# Patient Record
Sex: Female | Born: 1960 | Race: Black or African American | Hispanic: No | Marital: Married | Smoking: Never smoker
Health system: Southern US, Community
[De-identification: ages and names within clinical notes are randomized; demographics above are authoritative.]

## PROBLEM LIST (undated history)

## (undated) DIAGNOSIS — I10 Essential (primary) hypertension: Secondary | ICD-10-CM

## (undated) DIAGNOSIS — E119 Type 2 diabetes mellitus without complications: Secondary | ICD-10-CM

## (undated) HISTORY — DX: Type 2 diabetes mellitus without complications: E11.9

## (undated) HISTORY — DX: Essential (primary) hypertension: I10

## (undated) HISTORY — PX: ECTOPIC PREGNANCY SURGERY: SHX613

---

## 2006-06-23 ENCOUNTER — Encounter: Admission: RE | Admit: 2006-06-23 | Discharge: 2006-06-23 | Payer: Self-pay | Admitting: Internal Medicine

## 2006-06-29 ENCOUNTER — Other Ambulatory Visit: Admission: RE | Admit: 2006-06-29 | Discharge: 2006-06-29 | Payer: Self-pay | Admitting: Obstetrics and Gynecology

## 2007-10-21 ENCOUNTER — Encounter: Admission: RE | Admit: 2007-10-21 | Discharge: 2007-10-21 | Payer: Self-pay | Admitting: Internal Medicine

## 2007-10-28 ENCOUNTER — Encounter: Admission: RE | Admit: 2007-10-28 | Discharge: 2007-10-28 | Payer: Self-pay | Admitting: Internal Medicine

## 2008-06-12 ENCOUNTER — Emergency Department: Payer: Self-pay | Admitting: Emergency Medicine

## 2009-07-03 ENCOUNTER — Encounter: Admission: RE | Admit: 2009-07-03 | Discharge: 2009-07-03 | Payer: Self-pay | Admitting: Internal Medicine

## 2010-07-04 ENCOUNTER — Encounter: Admission: RE | Admit: 2010-07-04 | Discharge: 2010-07-04 | Payer: Self-pay | Admitting: Internal Medicine

## 2011-01-19 ENCOUNTER — Encounter: Payer: Self-pay | Admitting: Internal Medicine

## 2011-06-10 ENCOUNTER — Other Ambulatory Visit: Payer: Self-pay | Admitting: Internal Medicine

## 2011-06-10 DIAGNOSIS — Z1231 Encounter for screening mammogram for malignant neoplasm of breast: Secondary | ICD-10-CM

## 2011-07-30 ENCOUNTER — Ambulatory Visit
Admission: RE | Admit: 2011-07-30 | Discharge: 2011-07-30 | Disposition: A | Discharge status: Home / Self Care | Payer: BC Managed Care – PPO | Source: Ambulatory Visit | Attending: Internal Medicine | Admitting: Internal Medicine

## 2011-07-30 DIAGNOSIS — Z1231 Encounter for screening mammogram for malignant neoplasm of breast: Secondary | ICD-10-CM

## 2013-06-02 ENCOUNTER — Other Ambulatory Visit: Payer: Self-pay

## 2013-06-02 DIAGNOSIS — Z1231 Encounter for screening mammogram for malignant neoplasm of breast: Secondary | ICD-10-CM

## 2013-07-15 ENCOUNTER — Ambulatory Visit
Admission: RE | Admit: 2013-07-15 | Discharge: 2013-07-15 | Disposition: A | Discharge status: Home / Self Care | Payer: BC Managed Care – PPO | Source: Ambulatory Visit

## 2013-07-15 DIAGNOSIS — Z1231 Encounter for screening mammogram for malignant neoplasm of breast: Secondary | ICD-10-CM

## 2014-08-22 ENCOUNTER — Other Ambulatory Visit: Payer: Self-pay

## 2014-08-22 DIAGNOSIS — Z1231 Encounter for screening mammogram for malignant neoplasm of breast: Secondary | ICD-10-CM

## 2014-09-21 ENCOUNTER — Ambulatory Visit: Payer: BC Managed Care – PPO

## 2014-10-19 ENCOUNTER — Ambulatory Visit: Payer: BC Managed Care – PPO

## 2014-11-16 ENCOUNTER — Ambulatory Visit: Payer: BC Managed Care – PPO

## 2014-12-05 ENCOUNTER — Ambulatory Visit: Payer: BC Managed Care – PPO

## 2014-12-27 ENCOUNTER — Ambulatory Visit: Payer: BC Managed Care – PPO

## 2015-02-21 DIAGNOSIS — D219 Benign neoplasm of connective and other soft tissue, unspecified: Secondary | ICD-10-CM | POA: Insufficient documentation

## 2017-06-11 ENCOUNTER — Other Ambulatory Visit: Payer: Self-pay | Admitting: Internal Medicine

## 2017-06-11 DIAGNOSIS — Z1231 Encounter for screening mammogram for malignant neoplasm of breast: Secondary | ICD-10-CM

## 2017-07-06 ENCOUNTER — Ambulatory Visit: Payer: BC Managed Care – PPO

## 2017-07-07 ENCOUNTER — Ambulatory Visit
Admission: RE | Admit: 2017-07-07 | Discharge: 2017-07-07 | Disposition: A | Discharge status: Home / Self Care | Payer: BC Managed Care – PPO | Source: Ambulatory Visit | Attending: Internal Medicine | Admitting: Internal Medicine

## 2017-07-07 DIAGNOSIS — Z1231 Encounter for screening mammogram for malignant neoplasm of breast: Secondary | ICD-10-CM

## 2018-06-15 ENCOUNTER — Ambulatory Visit (INDEPENDENT_AMBULATORY_CARE_PROVIDER_SITE_OTHER): Payer: BC Managed Care – PPO | Admitting: Family Medicine

## 2018-06-15 ENCOUNTER — Encounter: Payer: Self-pay | Admitting: Family Medicine

## 2018-06-15 VITALS — BP 146/89 | HR 81 | Ht 62.0 in | Wt 159.0 lb

## 2018-06-15 DIAGNOSIS — Z1239 Encounter for other screening for malignant neoplasm of breast: Secondary | ICD-10-CM

## 2018-06-15 DIAGNOSIS — Z01419 Encounter for gynecological examination (general) (routine) without abnormal findings: Secondary | ICD-10-CM | POA: Diagnosis not present

## 2018-06-15 DIAGNOSIS — Z1231 Encounter for screening mammogram for malignant neoplasm of breast: Secondary | ICD-10-CM

## 2018-06-15 DIAGNOSIS — Z1151 Encounter for screening for human papillomavirus (HPV): Secondary | ICD-10-CM | POA: Diagnosis not present

## 2018-06-15 DIAGNOSIS — Z124 Encounter for screening for malignant neoplasm of cervix: Secondary | ICD-10-CM

## 2018-06-15 DIAGNOSIS — N841 Polyp of cervix uteri: Secondary | ICD-10-CM

## 2018-06-15 NOTE — Patient Instructions (Signed)
Preventive Care 40-64 Years, Female Preventive care refers to lifestyle choices and visits with your health care provider that can promote health and wellness. What does preventive care include?  A yearly physical exam. This is also called an annual well check.  Dental exams once or twice a year.  Routine eye exams. Ask your health care provider how often you should have your eyes checked.  Personal lifestyle choices, including: ? Daily care of your teeth and gums. ? Regular physical activity. ? Eating a healthy diet. ? Avoiding tobacco and drug use. ? Limiting alcohol use. ? Practicing safe sex. ? Taking low-dose aspirin daily starting at age 58. ? Taking vitamin and mineral supplements as recommended by your health care provider. What happens during an annual well check? The services and screenings done by your health care provider during your annual well check will depend on your age, overall health, lifestyle risk factors, and family history of disease. Counseling Your health care provider may ask you questions about your:  Alcohol use.  Tobacco use.  Drug use.  Emotional well-being.  Home and relationship well-being.  Sexual activity.  Eating habits.  Work and work Statistician.  Method of birth control.  Menstrual cycle.  Pregnancy history.  Screening You may have the following tests or measurements:  Height, weight, and BMI.  Blood pressure.  Lipid and cholesterol levels. These may be checked every 5 years, or more frequently if you are over 81 years old.  Skin check.  Lung cancer screening. You may have this screening every year starting at age 78 if you have a 30-pack-year history of smoking and currently smoke or have quit within the past 15 years.  Fecal occult blood test (FOBT) of the stool. You may have this test every year starting at age 65.  Flexible sigmoidoscopy or colonoscopy. You may have a sigmoidoscopy every 5 years or a colonoscopy  every 10 years starting at age 30.  Hepatitis C blood test.  Hepatitis B blood test.  Sexually transmitted disease (STD) testing.  Diabetes screening. This is done by checking your blood sugar (glucose) after you have not eaten for a while (fasting). You may have this done every 1-3 years.  Mammogram. This may be done every 1-2 years. Talk to your health care provider about when you should start having regular mammograms. This may depend on whether you have a family history of breast cancer.  BRCA-related cancer screening. This may be done if you have a family history of breast, ovarian, tubal, or peritoneal cancers.  Pelvic exam and Pap test. This may be done every 3 years starting at age 80. Starting at age 36, this may be done every 5 years if you have a Pap test in combination with an HPV test.  Bone density scan. This is done to screen for osteoporosis. You may have this scan if you are at high risk for osteoporosis.  Discuss your test results, treatment options, and if necessary, the need for more tests with your health care provider. Vaccines Your health care provider may recommend certain vaccines, such as:  Influenza vaccine. This is recommended every year.  Tetanus, diphtheria, and acellular pertussis (Tdap, Td) vaccine. You may need a Td booster every 10 years.  Varicella vaccine. You may need this if you have not been vaccinated.  Zoster vaccine. You may need this after age 5.  Measles, mumps, and rubella (MMR) vaccine. You may need at least one dose of MMR if you were born in  1959 or later. You may also need a second dose.  Pneumococcal 13-valent conjugate (PCV13) vaccine. You may need this if you have certain conditions and were not previously vaccinated.  Pneumococcal polysaccharide (PPSV23) vaccine. You may need one or two doses if you smoke cigarettes or if you have certain conditions.  Meningococcal vaccine. You may need this if you have certain  conditions.  Hepatitis A vaccine. You may need this if you have certain conditions or if you travel or work in places where you may be exposed to hepatitis A.  Hepatitis B vaccine. You may need this if you have certain conditions or if you travel or work in places where you may be exposed to hepatitis B.  Haemophilus influenzae type b (Hib) vaccine. You may need this if you have certain conditions.  Talk to your health care provider about which screenings and vaccines you need and how often you need them. This information is not intended to replace advice given to you by your health care provider. Make sure you discuss any questions you have with your health care provider. Document Released: 01/11/2016 Document Revised: 09/03/2016 Document Reviewed: 10/16/2015 Elsevier Interactive Patient Education  2018 Elsevier Inc[employer]  

## 2018-06-15 NOTE — Progress Notes (Signed)
Subjective:     Alisha Young is a 57 y.o. female and is here for a comprehensive physical exam. The patient reports no problems.  Social History   Socioeconomic History  . Marital status: Married    Spouse name: Not on file  . Number of children: Not on file  . Years of education: Not on file  . Highest education level: Not on file  Occupational History  . Not on file  Social Needs  . Financial resource strain: Not on file  . Food insecurity:    Worry: Not on file    Inability: Not on file  . Transportation needs:    Medical: Not on file    Non-medical: Not on file  Tobacco Use  . Smoking status: Never Smoker  . Smokeless tobacco: Never Used  Substance and Sexual Activity  . Alcohol use: Yes    Comment: once monthly  . Drug use: Never  . Sexual activity: Not Currently  Lifestyle  . Physical activity:    Days per week: Not on file    Minutes per session: Not on file  . Stress: Not on file  Relationships  . Social connections:    Talks on phone: Not on file    Gets together: Not on file    Attends religious service: Not on file    Active member of club or organization: Not on file    Attends meetings of clubs or organizations: Not on file    Relationship status: Not on file  . Intimate partner violence:    Fear of current or ex partner: Not on file    Emotionally abused: Not on file    Physically abused: Not on file    Forced sexual activity: Not on file  Other Topics Concern  . Not on file  Social History Narrative  . Not on file   Health Maintenance  Topic Date Due  . Hepatitis C Screening  08/29/1959  . HIV Screening  01/23/1974  . TETANUS/TDAP  01/23/1978  . PAP SMEAR  01/24/1980  . COLONOSCOPY  01/23/2009  . MAMMOGRAM  07/16/2015  . INFLUENZA VACCINE  07/29/2018    The following portions of the patient's history were reviewed and updated as appropriate: allergies, current medications, past family history, past medical history, past social  history, past surgical history and problem list.  Review of Systems Pertinent items noted in HPI and remainder of comprehensive ROS otherwise negative.   Objective:    BP (!) 146/89   Pulse 81   Ht 5\' 2"  (1.575 m)   Wt 159 lb (72.1 kg)   LMP 05/16/2013   BMI 29.08 kg/m  General appearance: alert, cooperative and appears stated age Head: Normocephalic, without obvious abnormality, atraumatic Neck: no adenopathy, supple, symmetrical, trachea midline and thyroid not enlarged, symmetric, no tenderness/mass/nodules Lungs: clear to auscultation bilaterally Breasts: normal appearance, no masses or tenderness Heart: regular rate and rhythm, S1, S2 normal, no murmur, click, rub or gallop Abdomen: soft, non-tender; bowel sounds normal; no masses,  no organomegaly Pelvic: external genitalia normal, no adnexal masses or tenderness, no cervical motion tenderness, uterus normal size, shape, and consistency, vagina normal without discharge and cervical polyp noted Extremities: extremities normal, atraumatic, no cyanosis or edema Pulses: 2+ and symmetric Skin: Skin color, texture, turgor normal. No rashes or lesions Lymph nodes: Cervical, supraclavicular, and axillary nodes normal. Neurologic: Grossly normal  Procedure: Cervix visualized and polyp noted.  After pap smear obtained.  Ring forcep applied to cervix  and twisting motion removed polyp intact.  Hemostasis obtained with pressure.  Assessment:    Healthy female exam.      Plan:     Screening for malignant neoplasm of cervix - Plan: Cytology - PAP  Encounter for gynecological examination without abnormal finding  Screening for breast cancer - Plan: MM DIGITAL SCREENING BILATERAL  Cervical polyp - send to pathology - Plan: Surgical pathology  Return in 1 year (on 06/16/2019).  See After Visit Summary for Counseling Recommendations

## 2018-06-17 LAB — CYTOLOGY - PAP
Diagnosis: NEGATIVE
HPV: NOT DETECTED

## 2018-07-29 ENCOUNTER — Ambulatory Visit
Admission: RE | Admit: 2018-07-29 | Discharge: 2018-07-29 | Disposition: A | Discharge status: Home / Self Care | Payer: BC Managed Care – PPO | Source: Ambulatory Visit | Attending: Family Medicine | Admitting: Family Medicine

## 2018-07-29 DIAGNOSIS — Z1239 Encounter for other screening for malignant neoplasm of breast: Secondary | ICD-10-CM

## 2018-11-23 ENCOUNTER — Emergency Department
Admission: EM | Admit: 2018-11-23 | Discharge: 2018-11-23 | Disposition: A | Discharge status: Home / Self Care | Payer: BC Managed Care – PPO | Attending: Emergency Medicine | Admitting: Emergency Medicine

## 2018-11-23 ENCOUNTER — Encounter: Payer: Self-pay | Admitting: Emergency Medicine

## 2018-11-23 ENCOUNTER — Other Ambulatory Visit: Payer: Self-pay

## 2018-11-23 DIAGNOSIS — I10 Essential (primary) hypertension: Secondary | ICD-10-CM | POA: Diagnosis not present

## 2018-11-23 DIAGNOSIS — R002 Palpitations: Secondary | ICD-10-CM | POA: Insufficient documentation

## 2018-11-23 DIAGNOSIS — Z79899 Other long term (current) drug therapy: Secondary | ICD-10-CM | POA: Diagnosis not present

## 2018-11-23 DIAGNOSIS — E119 Type 2 diabetes mellitus without complications: Secondary | ICD-10-CM | POA: Diagnosis not present

## 2018-11-23 DIAGNOSIS — Z7984 Long term (current) use of oral hypoglycemic drugs: Secondary | ICD-10-CM | POA: Insufficient documentation

## 2018-11-23 LAB — BASIC METABOLIC PANEL
Anion gap: 8 (ref 5–15)
BUN: 13 mg/dL (ref 6–20)
CALCIUM: 9.5 mg/dL (ref 8.9–10.3)
CO2: 27 mmol/L (ref 22–32)
Chloride: 104 mmol/L (ref 98–111)
Creatinine, Ser: 0.78 mg/dL (ref 0.44–1.00)
GLUCOSE: 139 mg/dL — AB (ref 70–99)
Potassium: 3.9 mmol/L (ref 3.5–5.1)
Sodium: 139 mmol/L (ref 135–145)

## 2018-11-23 LAB — CBC WITH DIFFERENTIAL/PLATELET
Abs Immature Granulocytes: 0.01 10*3/uL (ref 0.00–0.07)
Basophils Absolute: 0 10*3/uL (ref 0.0–0.1)
Basophils Relative: 0 %
EOS ABS: 0.1 10*3/uL (ref 0.0–0.5)
Eosinophils Relative: 2 %
HCT: 47.7 % — ABNORMAL HIGH (ref 36.0–46.0)
Hemoglobin: 15.5 g/dL — ABNORMAL HIGH (ref 12.0–15.0)
IMMATURE GRANULOCYTES: 0 %
Lymphocytes Relative: 42 %
Lymphs Abs: 2.2 10*3/uL (ref 0.7–4.0)
MCH: 29.5 pg (ref 26.0–34.0)
MCHC: 32.5 g/dL (ref 30.0–36.0)
MCV: 90.9 fL (ref 80.0–100.0)
MONOS PCT: 9 %
Monocytes Absolute: 0.5 10*3/uL (ref 0.1–1.0)
NEUTROS ABS: 2.5 10*3/uL (ref 1.7–7.7)
NEUTROS PCT: 47 %
NRBC: 0 % (ref 0.0–0.2)
Platelets: 179 10*3/uL (ref 150–400)
RBC: 5.25 MIL/uL — AB (ref 3.87–5.11)
RDW: 12 % (ref 11.5–15.5)
WBC: 5.3 10*3/uL (ref 4.0–10.5)

## 2018-11-23 LAB — TROPONIN I

## 2018-11-23 NOTE — Discharge Instructions (Addendum)
Your workup in the Emergency Department today was reassuring.  We did not find any specific abnormalities.  We recommend you drink plenty of fluids, take your regular medications and/or any new ones prescribed today, and follow up with the doctor(s) listed in these documents as recommended.  Return to the Emergency Department if you develop new or worsening symptoms that concern you.  

## 2018-11-23 NOTE — ED Notes (Signed)
Pt states that she woke up this morning because she felt like her heart was racing. When she took her BP it was 200/113. Pt has Hx of HTN. Pt has prescription for Lisinopril. States that it makes her cough so she tries a more herbal approach. Yesterday she was prescribed Losartin, but has not started it yet. Family at bedside.

## 2018-11-23 NOTE — ED Triage Notes (Signed)
Patient ambulatory to triage with steady gait, without difficulty or distress noted; reports 200/116 BP at home and sensation of heart racing; denies hx of same; denies pain

## 2018-11-23 NOTE — ED Provider Notes (Signed)
[facility] Emergency Department Provider Note  ____________________________________________   First MD Initiated Contact with Patient 11/23/18 0533     (approximate)  I have reviewed the triage vital signs and the nursing notes.   HISTORY  Chief Complaint Hypertension    HPI Alisha Young is a 57 y.o. female whose medical history includes hypertension and well-controlled diabetes who presents by private vehicle for evaluation of hypertension and palpitations.  She reports that she has been working with her primary care doctor recently on appropriate medication regimen.  She has been taking lisinopril-hydrochlorothiazide, but because of a dry cough associated with ACE inhibitors, a prescription was just called in yesterday for losartan and hydrochlorothiazide.  However she has not picked it up yet.  She awoke tonight feeling palpitations.  This made her concerned because she is not used to feeling the palpitations or racing heart beat.  She checked her blood pressure and it was elevated and she checked it several more times and kept going up, with the last one reportedly being 200/116.  This concerned her enough she felt she should come to the hospital to be evaluated.   She admits that she was becoming increasingly anxious and agrees that this may have made the symptoms worse.  Upon arrival in the emergency department she is much more calm and no longer feels the palpitations or any symptoms at all.  Her blood pressure is still elevated but is more consistent with normal for her in the 157/100 range.  At no point did she have any chest pain or shortness of breath.  She denies fever/chills, nausea, vomiting, abdominal pain, arm pain, nor numbness and weakness in any of her extremities.  Past Medical History:  Diagnosis Date  . Diabetes ([place])   . Hypertension     Patient Active Problem List   Diagnosis Date Noted  . Fibroids 02/21/2015    Past  Surgical History:  Procedure Laterality Date  . ECTOPIC PREGNANCY SURGERY      Prior to Admission medications   Medication Sig Start Date End Date Taking? Authorizing Provider  ferrous sulfate 325 (65 FE) MG EC tablet Take 325 mg by mouth 3 (three) times daily with meals.    [provider]  lisinopril-hydrochlorothiazide (PRINZIDE,ZESTORETIC) 10-12.5 MG tablet  06/08/18   [provider]  metFORMIN (GLUCOPHAGE) 500 MG tablet  06/08/18   [provider]  Multiple Vitamin (MULTIVITAMIN) capsule Take 1 capsule by mouth daily.    [provider]  Omega-3 Fatty Acids (FISH OIL BURP-LESS) 1000 MG CAPS Take by mouth.    [provider]  vitamin B-12 (CYANOCOBALAMIN) 500 MCG tablet Take 500 mcg by mouth daily.    [provider]    Allergies Erythromycin  Family History  Problem Relation Age of Onset  . Ovarian cancer Paternal Grandmother   . Breast cancer Paternal Aunt     Social History Social History   Tobacco Use  . Smoking status: Never Smoker  . Smokeless tobacco: Never Used  Substance Use Topics  . Alcohol use: Yes    Comment: once monthly  . Drug use: Never    Review of Systems Constitutional: No fever/chills Eyes: No visual changes. ENT: No sore throat. Cardiovascular: Palpitations, now resolved.  Denies chest pain. Respiratory: Denies shortness of breath. Gastrointestinal: No abdominal pain.  No nausea, no vomiting.  No diarrhea.  No constipation. Genitourinary: Negative for dysuria. Musculoskeletal: Negative for neck pain.  Negative for back pain. Integumentary: Negative  for rash. Neurological: Negative for headaches, focal weakness or numbness.   ____________________________________________   PHYSICAL EXAM:  VITAL SIGNS: ED Triage Vitals  Enc Vitals Group     BP 11/23/18 0511 (!) 168/99     Pulse Rate 11/23/18 0511 99     Resp 11/23/18 0511 20     Temp 11/23/18 0511 97.9 F (36.6 C)     Temp Source  11/23/18 0511 Oral     SpO2 11/23/18 0511 100 %     Weight 11/23/18 0502 68 kg (150 lb)     Height 11/23/18 0502 1.575 m (5\' 2" )     Head Circumference --      Peak Flow --      Pain Score 11/23/18 0502 0     Pain Loc --      Pain Edu? --      Excl. in [place]? --     Constitutional: Alert and oriented. Well appearing and in no acute distress. Eyes: Conjunctivae are normal.  Head: Atraumatic. Nose: No congestion/rhinnorhea. Mouth/Throat: Mucous membranes are moist. Neck: No stridor.  No meningeal signs.   Cardiovascular: Normal rate, regular rhythm. Good peripheral circulation. Grossly normal heart sounds. Respiratory: Normal respiratory effort.  No retractions. Lungs CTAB. Gastrointestinal: Soft and nontender. No distention.  Musculoskeletal: No lower extremity tenderness nor edema. No gross deformities of extremities. Neurologic:  Normal speech and language. No gross focal neurologic deficits are appreciated.  Skin:  Skin is warm, dry and intact. No rash noted. Psychiatric: Mood and affect are normal. Speech and behavior are normal.  ____________________________________________   LABS (all labs ordered are listed, but only abnormal results are displayed)  Labs Reviewed  CBC WITH DIFFERENTIAL/PLATELET - Abnormal; Notable for the following components:      Result Value   RBC 5.25 (*)    Hemoglobin 15.5 (*)    HCT 47.7 (*)    All other components within normal limits  BASIC METABOLIC PANEL - Abnormal; Notable for the following components:   Glucose, Bld 139 (*)    All other components within normal limits  TROPONIN I   ____________________________________________  EKG  ED ECG REPORT I, Hinda Kehr, the attending physician, personally viewed and interpreted this ECG.  Date: 11/23/2018 EKG Time: 5:09 AM Rate: 104 Rhythm: Mild sinus tachycardia QRS Axis: normal Intervals: normal ST/T Wave abnormalities: normal Narrative Interpretation: no evidence of acute  ischemia  ____________________________________________  RADIOLOGY   ED MD interpretation: No indication for imaging  Official radiology report(s): No results found.  ____________________________________________   PROCEDURES  Critical Care performed: No   Procedure(s) performed:   Procedures   ____________________________________________   INITIAL IMPRESSION / ASSESSMENT AND PLAN / ED COURSE  As part of my medical decision making, I reviewed the following data within the [facility] notes reviewed and incorporated, Labs reviewed , EKG interpreted  and Old chart reviewed  Differential diagnosis includes, but is not limited to, nonspecific palpitations, SVT, A. fib with RVR, ACS, PE.  The patient is completely at her baseline and at no point did she have any chest pain nor shortness of breath.  She believes that when she felt palpitations, her anxiety contributed very much to not only how she was feeling but the elevated blood pressure.  Now that she is in the emergency department she has calmed down and her blood pressure is improving and her symptoms have resolved.  Her lab work is all within normal limits including no evidence of renal  dysfunction which is reassuring and that it is less likely she is suffering from hypertensive urgency/emergency.  There is no evidence of acute ischemia on her EKG and she is calm, asymptomatic, and prefers to go home.  She is low risk for ACS based on her HEART score and she is PERC negative after the initial tachycardia upon triage has resolved.    I offered a second troponin but she does not want to stay for it and I do not feel it is necessary.  She will follow-up with her primary care doctor and she will get her new antihypertensive medication filled this morning and began treatment.  I gave my usual and customary return precautions.      ____________________________________________  FINAL CLINICAL IMPRESSION(S)  / ED DIAGNOSES  Final diagnoses:  Essential hypertension  Palpitations     MEDICATIONS GIVEN DURING THIS VISIT:  Medications - No data to display   ED Discharge Orders    None       Note:  This document was prepared using Dragon voice recognition software and may include unintentional dictation errors.    Hinda Kehr, MD 11/23/18 [phone]

## 2019-08-05 ENCOUNTER — Other Ambulatory Visit: Payer: Self-pay | Admitting: Internal Medicine

## 2019-08-05 DIAGNOSIS — Z1231 Encounter for screening mammogram for malignant neoplasm of breast: Secondary | ICD-10-CM

## 2019-09-20 ENCOUNTER — Other Ambulatory Visit: Payer: Self-pay

## 2019-09-20 ENCOUNTER — Ambulatory Visit
Admission: RE | Admit: 2019-09-20 | Discharge: 2019-09-20 | Disposition: A | Discharge status: Home / Self Care | Payer: BC Managed Care – PPO | Source: Ambulatory Visit | Attending: Internal Medicine | Admitting: Internal Medicine

## 2019-09-20 DIAGNOSIS — Z1231 Encounter for screening mammogram for malignant neoplasm of breast: Secondary | ICD-10-CM

## 2019-10-04 ENCOUNTER — Ambulatory Visit (INDEPENDENT_AMBULATORY_CARE_PROVIDER_SITE_OTHER): Payer: BC Managed Care – PPO | Admitting: Family Medicine

## 2019-10-04 ENCOUNTER — Other Ambulatory Visit: Payer: Self-pay

## 2019-10-04 ENCOUNTER — Encounter: Payer: Self-pay | Admitting: Family Medicine

## 2019-10-04 VITALS — BP 149/93 | HR 72 | Wt 159.2 lb

## 2019-10-04 DIAGNOSIS — D219 Benign neoplasm of connective and other soft tissue, unspecified: Secondary | ICD-10-CM

## 2019-10-04 DIAGNOSIS — Z01419 Encounter for gynecological examination (general) (routine) without abnormal findings: Secondary | ICD-10-CM | POA: Diagnosis not present

## 2019-10-04 DIAGNOSIS — Z23 Encounter for immunization: Secondary | ICD-10-CM | POA: Diagnosis not present

## 2019-10-04 DIAGNOSIS — I10 Essential (primary) hypertension: Secondary | ICD-10-CM | POA: Insufficient documentation

## 2019-10-04 DIAGNOSIS — E119 Type 2 diabetes mellitus without complications: Secondary | ICD-10-CM

## 2019-10-04 NOTE — Assessment & Plan Note (Signed)
Per PCP 

## 2019-10-04 NOTE — Progress Notes (Signed)
  Subjective:     Alisha Young is a 58 y.o. female and is here for a comprehensive physical exam. The patient reports no problems.  The following portions of the patient's history were reviewed and updated as appropriate: allergies, current medications, past family history, past medical history, past social history, past surgical history and problem list.  Review of Systems Pertinent items noted in HPI and remainder of comprehensive ROS otherwise negative.   Objective:    BP (!) 149/93   Pulse 72   Wt 159 lb 3.2 oz (72.2 kg)   LMP 05/16/2013   BMI 29.12 kg/m  General appearance: alert, cooperative and appears stated age Head: Normocephalic, without obvious abnormality, atraumatic Neck: no adenopathy, supple, symmetrical, trachea midline and thyroid not enlarged, symmetric, no tenderness/mass/nodules Lungs: clear to auscultation bilaterally Breasts: normal appearance, no masses or tenderness Heart: regular rate and rhythm, S1, S2 normal, no murmur, click, rub or gallop Abdomen: soft, non-tender; bowel sounds normal; no masses,  no organomegaly Pelvic: cervix normal in appearance, external genitalia normal, no adnexal masses or tenderness, no cervical motion tenderness, rectovaginal septum normal, vagina normal without discharge and uterus 12-14 wk size, non-tender Extremities: extremities normal, atraumatic, no cyanosis or edema Pulses: 2+ and symmetric Skin: Skin color, texture, turgor normal. No rashes or lesions Lymph nodes: Cervical, supraclavicular, and axillary nodes normal. Neurologic: Grossly normal    Assessment:    Healthy female exam.      Plan:      Problem List Items Addressed This Visit      Unprioritized   Fibroids    Still enlarged, but no symptoms.      Hypertension    Per PCP      Relevant Medications   hydrochlorothiazide (MICROZIDE) 12.5 MG capsule   losartan (COZAAR) 50 MG tablet   Diabetes ([facility])   Relevant Medications   losartan  (COZAAR) 50 MG tablet    Other Visit Diagnoses    Encounter for gynecological examination without abnormal finding    -  Primary   Influenza vaccination administered at current visit       Relevant Orders   Flu Vaccine QUAD 36+ mos IM (Fluarix, Quad PF) (Completed)     Return in 1 year (on 10/03/2020).  See After Visit Summary for Counseling Recommendations

## 2019-10-04 NOTE — Progress Notes (Signed)
Last pap 06/15/2018-normal  Mammogram- 09/20/2019

## 2019-10-04 NOTE — Assessment & Plan Note (Signed)
Still enlarged, but no symptoms.

## 2020-03-09 ENCOUNTER — Other Ambulatory Visit: Payer: Self-pay | Admitting: Internal Medicine

## 2020-03-09 ENCOUNTER — Ambulatory Visit
Admission: RE | Admit: 2020-03-09 | Discharge: 2020-03-09 | Disposition: A | Discharge status: Home / Self Care | Payer: BC Managed Care – PPO | Source: Ambulatory Visit | Attending: Internal Medicine | Admitting: Internal Medicine

## 2020-03-09 DIAGNOSIS — M25559 Pain in unspecified hip: Secondary | ICD-10-CM

## 2020-09-20 ENCOUNTER — Other Ambulatory Visit: Payer: Self-pay | Admitting: Internal Medicine

## 2020-09-20 DIAGNOSIS — Z1231 Encounter for screening mammogram for malignant neoplasm of breast: Secondary | ICD-10-CM

## 2020-10-12 ENCOUNTER — Ambulatory Visit
Admission: RE | Admit: 2020-10-12 | Discharge: 2020-10-12 | Disposition: A | Discharge status: Home / Self Care | Payer: BC Managed Care – PPO | Source: Ambulatory Visit | Attending: Internal Medicine | Admitting: Internal Medicine

## 2020-10-12 ENCOUNTER — Other Ambulatory Visit: Payer: Self-pay

## 2020-10-12 DIAGNOSIS — Z1231 Encounter for screening mammogram for malignant neoplasm of breast: Secondary | ICD-10-CM

## 2021-10-04 ENCOUNTER — Other Ambulatory Visit: Payer: Self-pay | Admitting: Internal Medicine

## 2021-10-04 DIAGNOSIS — Z1231 Encounter for screening mammogram for malignant neoplasm of breast: Secondary | ICD-10-CM

## 2021-11-05 ENCOUNTER — Ambulatory Visit
Admission: RE | Admit: 2021-11-05 | Discharge: 2021-11-05 | Disposition: A | Discharge status: Home / Self Care | Payer: BC Managed Care – PPO | Source: Ambulatory Visit | Attending: Internal Medicine | Admitting: Internal Medicine

## 2021-11-05 ENCOUNTER — Other Ambulatory Visit: Payer: Self-pay

## 2021-11-05 DIAGNOSIS — Z1231 Encounter for screening mammogram for malignant neoplasm of breast: Secondary | ICD-10-CM

## 2022-03-05 IMAGING — MG MM DIGITAL SCREENING BILAT W/ TOMO AND CAD
8 series · 8 of 24 positions shown · non-contrast
Comparison: Previous exam(s).

CLINICAL DATA: Screening.

EXAM:
DIGITAL SCREENING BILATERAL MAMMOGRAM WITH TOMOSYNTHESIS AND CAD
TECHNIQUE: Bilateral screening digital craniocaudal and mediolateral oblique
mammograms were obtained. Bilateral screening digital breast
tomosynthesis was performed. The images were evaluated with
computer-aided detection.

[R CC synth-2D]
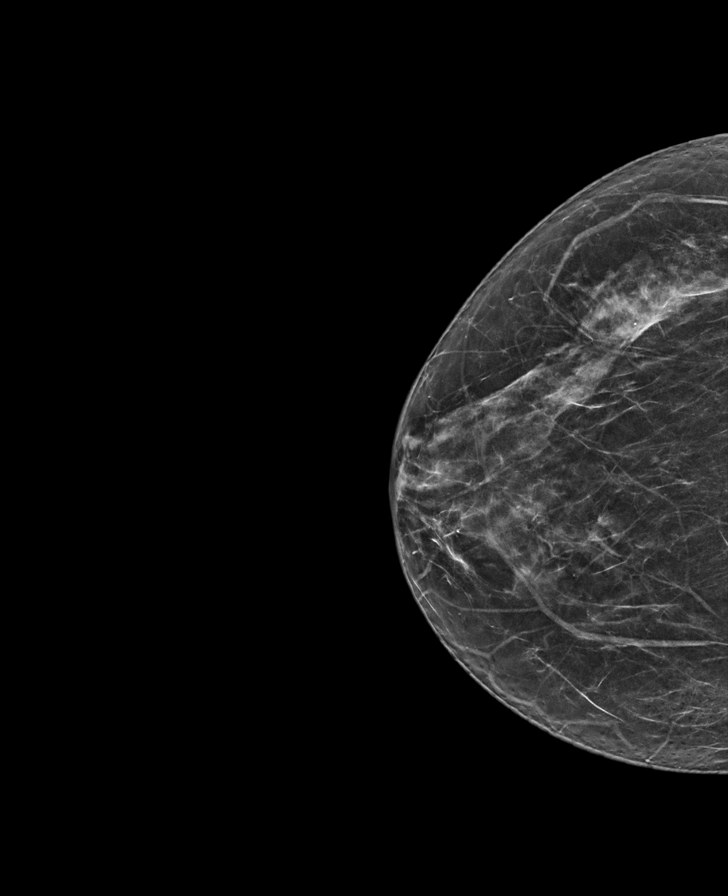

[R MLO synth-2D]
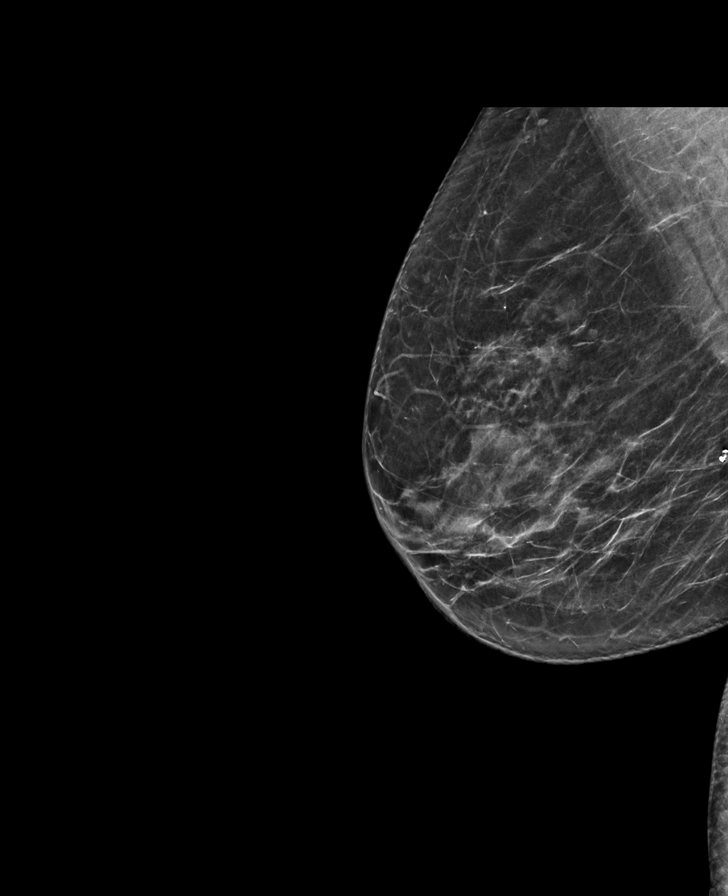

[L MLO synth-2D]
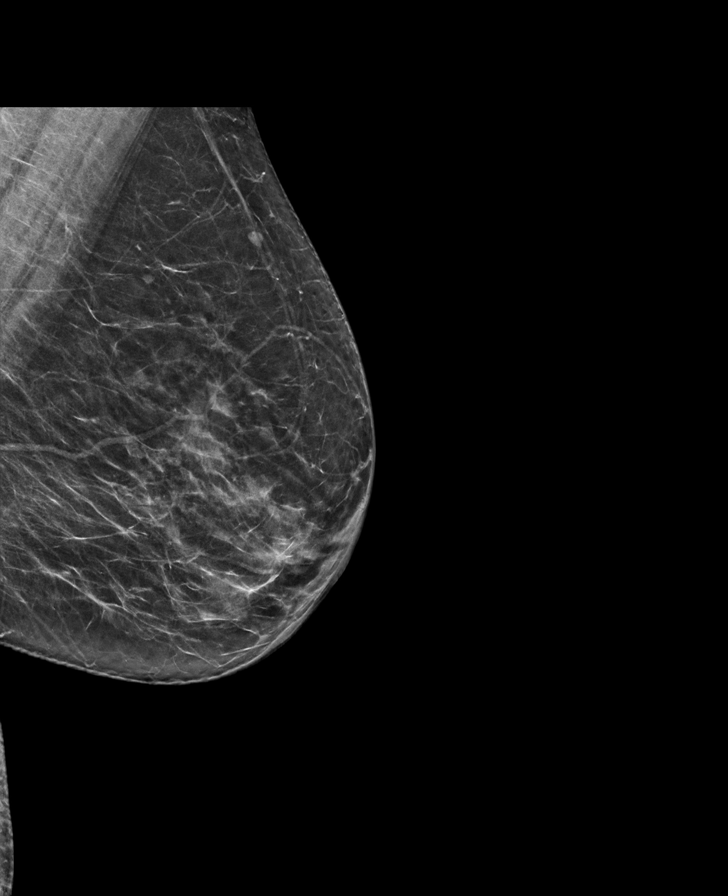

[L CC synth-2D]
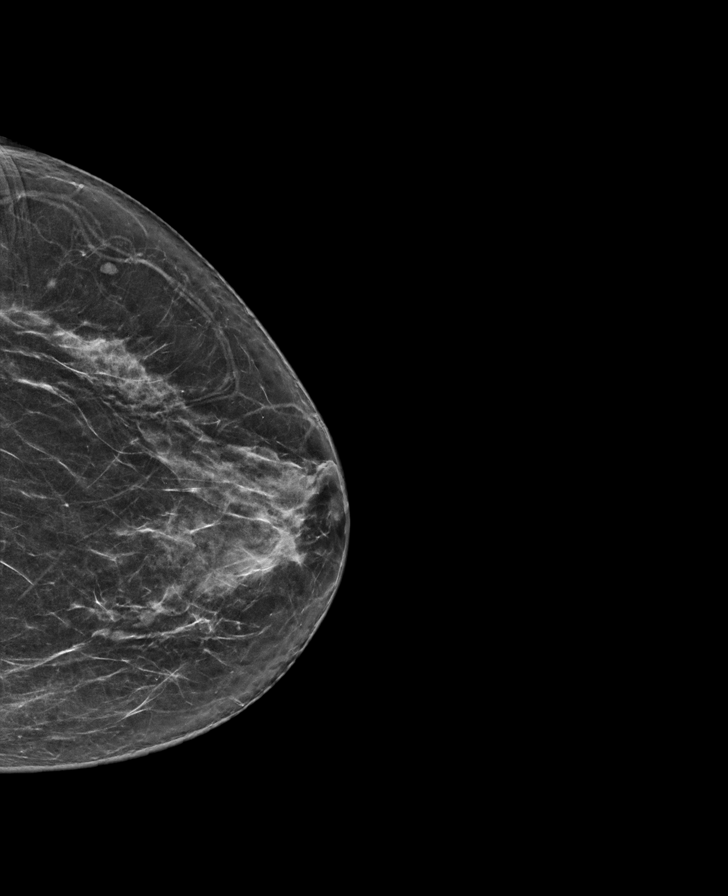

[L CC tomo · tomo slice 32/63.0]
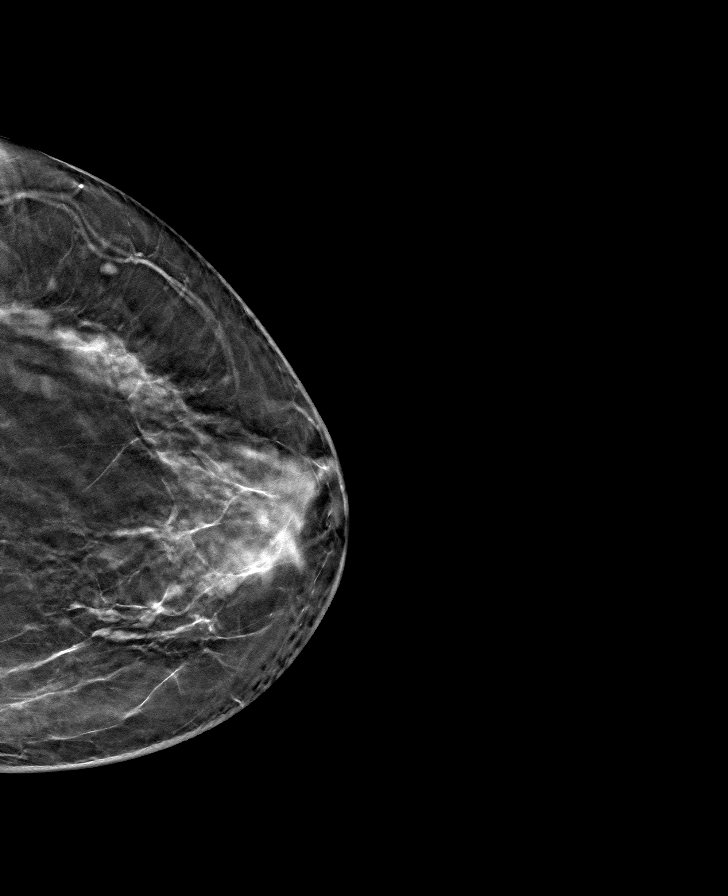

[L MLO tomo · tomo slice 33/66.0]
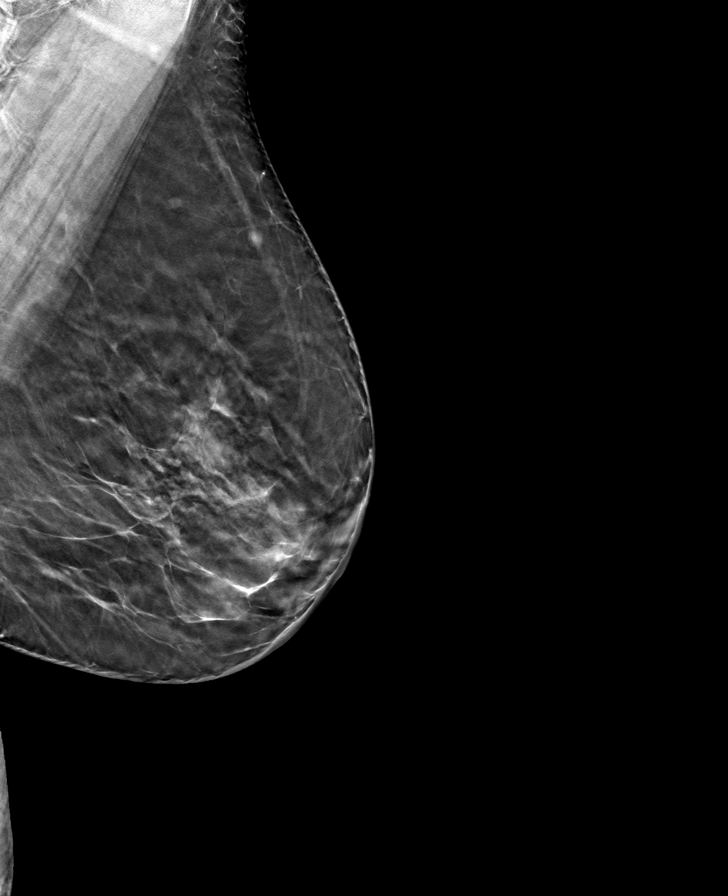

[R CC tomo · tomo slice 31/60.0]
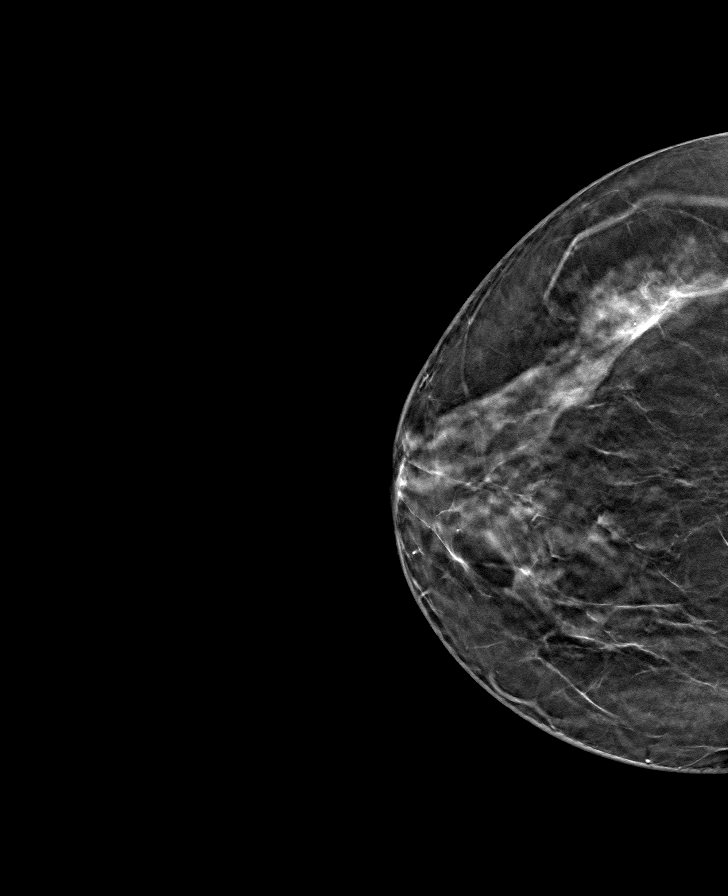

[R MLO tomo · tomo slice 35/70.0]
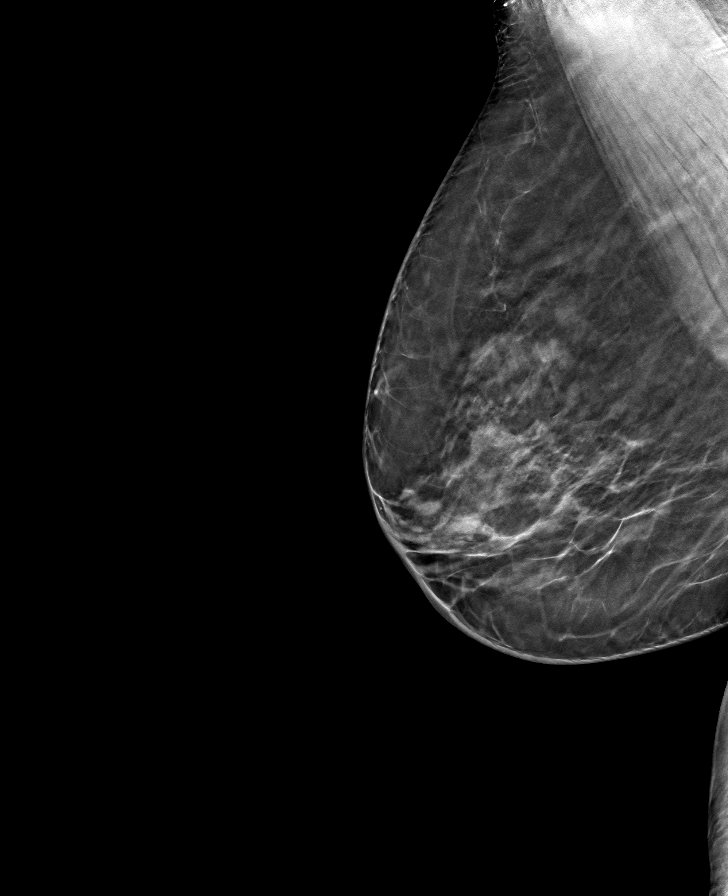

[8 of 24 positions shown; findings below may reference images not displayed]

ACR Breast Density Category b: There are scattered areas of
fibroglandular density.
FINDINGS: There are no findings suspicious for malignancy.
IMPRESSION: No mammographic evidence of malignancy. A result letter of this
screening mammogram will be mailed directly to the patient.

RECOMMENDATION:
Screening mammogram in one year. (Code:51-O-LD2)

BI-RADS CATEGORY  1: Negative.

## 2022-10-17 ENCOUNTER — Other Ambulatory Visit: Payer: Self-pay | Admitting: Internal Medicine

## 2022-10-17 DIAGNOSIS — Z139 Encounter for screening, unspecified: Secondary | ICD-10-CM

## 2022-12-09 ENCOUNTER — Ambulatory Visit: Payer: BC Managed Care – PPO

## 2023-02-04 ENCOUNTER — Ambulatory Visit
Admission: RE | Admit: 2023-02-04 | Discharge: 2023-02-04 | Disposition: A | Discharge status: Home / Self Care | Payer: BC Managed Care – PPO | Source: Ambulatory Visit | Attending: Internal Medicine | Admitting: Internal Medicine

## 2023-02-04 DIAGNOSIS — Z139 Encounter for screening, unspecified: Secondary | ICD-10-CM

## 2024-02-01 ENCOUNTER — Other Ambulatory Visit: Payer: Self-pay | Admitting: Internal Medicine

## 2024-02-01 DIAGNOSIS — Z1231 Encounter for screening mammogram for malignant neoplasm of breast: Secondary | ICD-10-CM

## 2024-02-17 ENCOUNTER — Ambulatory Visit
Admission: RE | Admit: 2024-02-17 | Discharge: 2024-02-17 | Disposition: A | Discharge status: Home / Self Care | Payer: 59 | Source: Ambulatory Visit | Attending: Internal Medicine | Admitting: Internal Medicine

## 2024-02-17 DIAGNOSIS — Z1231 Encounter for screening mammogram for malignant neoplasm of breast: Secondary | ICD-10-CM

## 2024-02-22 ENCOUNTER — Other Ambulatory Visit: Payer: Self-pay | Admitting: Internal Medicine

## 2024-02-22 DIAGNOSIS — R928 Other abnormal and inconclusive findings on diagnostic imaging of breast: Secondary | ICD-10-CM

## 2024-03-03 ENCOUNTER — Ambulatory Visit: Payer: 59

## 2024-03-03 ENCOUNTER — Ambulatory Visit
Admission: RE | Admit: 2024-03-03 | Discharge: 2024-03-03 | Disposition: A | Discharge status: Home / Self Care | Payer: 59 | Source: Ambulatory Visit | Attending: Internal Medicine | Admitting: Internal Medicine

## 2024-03-03 DIAGNOSIS — R928 Other abnormal and inconclusive findings on diagnostic imaging of breast: Secondary | ICD-10-CM

## 2024-04-27 ENCOUNTER — Encounter: Payer: 59 | Admitting: Obstetrics and Gynecology
